# Patient Record
Sex: Male | Born: 1990 | Race: White | Hispanic: No | Marital: Single | Smoking: Never smoker
Health system: Southern US, Community
[De-identification: ages and names within clinical notes are randomized; demographics above are authoritative.]

## PROBLEM LIST (undated history)

## (undated) DIAGNOSIS — Z91030 Bee allergy status: Secondary | ICD-10-CM

## (undated) DIAGNOSIS — Z91038 Other insect allergy status: Secondary | ICD-10-CM

## (undated) HISTORY — DX: Bee allergy status: Z91.030

## (undated) HISTORY — PX: ORTHOPEDIC SURGERY: SHX850

## (undated) HISTORY — DX: Other insect allergy status: Z91.038

---
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
## 2014-12-12 DIAGNOSIS — T63481A Toxic effect of venom of other arthropod, accidental (unintentional), initial encounter: Secondary | ICD-10-CM | POA: Insufficient documentation

## 2014-12-12 DIAGNOSIS — T782XXA Anaphylactic shock, unspecified, initial encounter: Secondary | ICD-10-CM

## 2015-01-14 ENCOUNTER — Ambulatory Visit (INDEPENDENT_AMBULATORY_CARE_PROVIDER_SITE_OTHER): Payer: BC Managed Care – PPO | Admitting: *Deleted

## 2015-01-14 DIAGNOSIS — T63441D Toxic effect of venom of bees, accidental (unintentional), subsequent encounter: Secondary | ICD-10-CM

## 2015-01-21 ENCOUNTER — Ambulatory Visit (INDEPENDENT_AMBULATORY_CARE_PROVIDER_SITE_OTHER): Payer: BC Managed Care – PPO | Admitting: *Deleted

## 2015-01-21 DIAGNOSIS — T63441D Toxic effect of venom of bees, accidental (unintentional), subsequent encounter: Secondary | ICD-10-CM

## 2015-02-04 ENCOUNTER — Ambulatory Visit (INDEPENDENT_AMBULATORY_CARE_PROVIDER_SITE_OTHER): Payer: BC Managed Care – PPO | Admitting: *Deleted

## 2015-02-04 DIAGNOSIS — T63481D Toxic effect of venom of other arthropod, accidental (unintentional), subsequent encounter: Secondary | ICD-10-CM | POA: Diagnosis not present

## 2015-02-04 DIAGNOSIS — T782XXD Anaphylactic shock, unspecified, subsequent encounter: Secondary | ICD-10-CM

## 2015-02-25 ENCOUNTER — Ambulatory Visit (INDEPENDENT_AMBULATORY_CARE_PROVIDER_SITE_OTHER): Payer: BC Managed Care – PPO

## 2015-02-25 DIAGNOSIS — T782XXD Anaphylactic shock, unspecified, subsequent encounter: Secondary | ICD-10-CM

## 2015-02-25 DIAGNOSIS — T63481D Toxic effect of venom of other arthropod, accidental (unintentional), subsequent encounter: Secondary | ICD-10-CM

## 2015-03-25 ENCOUNTER — Ambulatory Visit (INDEPENDENT_AMBULATORY_CARE_PROVIDER_SITE_OTHER): Payer: BC Managed Care – PPO | Admitting: *Deleted

## 2015-03-25 DIAGNOSIS — T63441D Toxic effect of venom of bees, accidental (unintentional), subsequent encounter: Secondary | ICD-10-CM | POA: Diagnosis not present

## 2015-04-25 ENCOUNTER — Ambulatory Visit (INDEPENDENT_AMBULATORY_CARE_PROVIDER_SITE_OTHER): Payer: BC Managed Care – PPO | Admitting: *Deleted

## 2015-04-25 DIAGNOSIS — J309 Allergic rhinitis, unspecified: Secondary | ICD-10-CM | POA: Diagnosis not present

## 2015-05-30 ENCOUNTER — Ambulatory Visit (INDEPENDENT_AMBULATORY_CARE_PROVIDER_SITE_OTHER): Payer: BC Managed Care – PPO | Admitting: *Deleted

## 2015-05-30 DIAGNOSIS — IMO0001 Reserved for inherently not codable concepts without codable children: Secondary | ICD-10-CM

## 2015-05-30 DIAGNOSIS — T63441D Toxic effect of venom of bees, accidental (unintentional), subsequent encounter: Secondary | ICD-10-CM

## 2015-07-11 ENCOUNTER — Ambulatory Visit (INDEPENDENT_AMBULATORY_CARE_PROVIDER_SITE_OTHER): Payer: BC Managed Care – PPO | Admitting: *Deleted

## 2015-07-11 DIAGNOSIS — T63441D Toxic effect of venom of bees, accidental (unintentional), subsequent encounter: Secondary | ICD-10-CM

## 2015-07-11 DIAGNOSIS — IMO0001 Reserved for inherently not codable concepts without codable children: Secondary | ICD-10-CM

## 2015-08-22 ENCOUNTER — Ambulatory Visit (INDEPENDENT_AMBULATORY_CARE_PROVIDER_SITE_OTHER): Payer: BC Managed Care – PPO

## 2015-08-22 DIAGNOSIS — T63441D Toxic effect of venom of bees, accidental (unintentional), subsequent encounter: Secondary | ICD-10-CM | POA: Diagnosis not present

## 2015-10-03 ENCOUNTER — Ambulatory Visit (INDEPENDENT_AMBULATORY_CARE_PROVIDER_SITE_OTHER): Payer: BC Managed Care – PPO | Admitting: *Deleted

## 2015-10-03 DIAGNOSIS — T63441D Toxic effect of venom of bees, accidental (unintentional), subsequent encounter: Secondary | ICD-10-CM

## 2015-10-04 DIAGNOSIS — T63441D Toxic effect of venom of bees, accidental (unintentional), subsequent encounter: Secondary | ICD-10-CM | POA: Diagnosis not present

## 2015-11-01 ENCOUNTER — Ambulatory Visit (INDEPENDENT_AMBULATORY_CARE_PROVIDER_SITE_OTHER): Payer: BC Managed Care – PPO | Admitting: *Deleted

## 2015-11-01 DIAGNOSIS — T63441D Toxic effect of venom of bees, accidental (unintentional), subsequent encounter: Secondary | ICD-10-CM

## 2015-12-06 ENCOUNTER — Ambulatory Visit (INDEPENDENT_AMBULATORY_CARE_PROVIDER_SITE_OTHER): Payer: BC Managed Care – PPO

## 2015-12-06 DIAGNOSIS — T63441D Toxic effect of venom of bees, accidental (unintentional), subsequent encounter: Secondary | ICD-10-CM | POA: Diagnosis not present

## 2015-12-06 DIAGNOSIS — IMO0001 Reserved for inherently not codable concepts without codable children: Secondary | ICD-10-CM

## 2016-01-13 ENCOUNTER — Ambulatory Visit (INDEPENDENT_AMBULATORY_CARE_PROVIDER_SITE_OTHER): Payer: BC Managed Care – PPO | Admitting: *Deleted

## 2016-01-13 DIAGNOSIS — T63441D Toxic effect of venom of bees, accidental (unintentional), subsequent encounter: Secondary | ICD-10-CM | POA: Diagnosis not present

## 2016-03-16 ENCOUNTER — Ambulatory Visit (INDEPENDENT_AMBULATORY_CARE_PROVIDER_SITE_OTHER): Payer: BC Managed Care – PPO

## 2016-03-16 DIAGNOSIS — T63441D Toxic effect of venom of bees, accidental (unintentional), subsequent encounter: Secondary | ICD-10-CM | POA: Diagnosis not present

## 2016-05-18 ENCOUNTER — Ambulatory Visit (INDEPENDENT_AMBULATORY_CARE_PROVIDER_SITE_OTHER): Payer: BC Managed Care – PPO | Admitting: *Deleted

## 2016-05-18 DIAGNOSIS — T63441D Toxic effect of venom of bees, accidental (unintentional), subsequent encounter: Secondary | ICD-10-CM | POA: Diagnosis not present

## 2016-07-13 ENCOUNTER — Ambulatory Visit (INDEPENDENT_AMBULATORY_CARE_PROVIDER_SITE_OTHER): Payer: BC Managed Care – PPO | Admitting: *Deleted

## 2016-07-13 DIAGNOSIS — T63441D Toxic effect of venom of bees, accidental (unintentional), subsequent encounter: Secondary | ICD-10-CM

## 2016-09-10 ENCOUNTER — Ambulatory Visit (INDEPENDENT_AMBULATORY_CARE_PROVIDER_SITE_OTHER): Payer: BC Managed Care – PPO

## 2016-09-10 DIAGNOSIS — T63441D Toxic effect of venom of bees, accidental (unintentional), subsequent encounter: Secondary | ICD-10-CM | POA: Diagnosis not present

## 2016-11-09 ENCOUNTER — Ambulatory Visit (INDEPENDENT_AMBULATORY_CARE_PROVIDER_SITE_OTHER): Payer: BC Managed Care – PPO | Admitting: *Deleted

## 2016-11-09 DIAGNOSIS — T63441D Toxic effect of venom of bees, accidental (unintentional), subsequent encounter: Secondary | ICD-10-CM | POA: Diagnosis not present

## 2017-03-25 ENCOUNTER — Ambulatory Visit (INDEPENDENT_AMBULATORY_CARE_PROVIDER_SITE_OTHER): Payer: BC Managed Care – PPO | Admitting: *Deleted

## 2017-03-25 DIAGNOSIS — T63441D Toxic effect of venom of bees, accidental (unintentional), subsequent encounter: Secondary | ICD-10-CM

## 2017-05-17 ENCOUNTER — Ambulatory Visit (INDEPENDENT_AMBULATORY_CARE_PROVIDER_SITE_OTHER): Payer: BC Managed Care – PPO | Admitting: *Deleted

## 2017-05-17 DIAGNOSIS — T63441D Toxic effect of venom of bees, accidental (unintentional), subsequent encounter: Secondary | ICD-10-CM | POA: Diagnosis not present

## 2017-05-17 DIAGNOSIS — IMO0001 Reserved for inherently not codable concepts without codable children: Secondary | ICD-10-CM

## 2017-06-28 ENCOUNTER — Ambulatory Visit (INDEPENDENT_AMBULATORY_CARE_PROVIDER_SITE_OTHER): Payer: BC Managed Care – PPO | Admitting: *Deleted

## 2017-06-28 DIAGNOSIS — T63441D Toxic effect of venom of bees, accidental (unintentional), subsequent encounter: Secondary | ICD-10-CM

## 2017-07-29 ENCOUNTER — Encounter: Payer: Self-pay | Admitting: Allergy and Immunology

## 2017-07-29 ENCOUNTER — Other Ambulatory Visit: Payer: Self-pay

## 2017-07-29 ENCOUNTER — Ambulatory Visit (INDEPENDENT_AMBULATORY_CARE_PROVIDER_SITE_OTHER): Payer: BC Managed Care – PPO | Admitting: Allergy and Immunology

## 2017-07-29 ENCOUNTER — Encounter (HOSPITAL_BASED_OUTPATIENT_CLINIC_OR_DEPARTMENT_OTHER): Payer: Self-pay | Admitting: Emergency Medicine

## 2017-07-29 ENCOUNTER — Emergency Department (HOSPITAL_BASED_OUTPATIENT_CLINIC_OR_DEPARTMENT_OTHER)
Admission: EM | Admit: 2017-07-29 | Discharge: 2017-07-29 | Disposition: A | Discharge status: Home / Self Care | Payer: BC Managed Care – PPO | Attending: Emergency Medicine | Admitting: Emergency Medicine

## 2017-07-29 VITALS — BP 140/68 | HR 76 | Resp 16 | Ht 68.5 in | Wt 188.0 lb

## 2017-07-29 DIAGNOSIS — L501 Idiopathic urticaria: Secondary | ICD-10-CM | POA: Diagnosis not present

## 2017-07-29 DIAGNOSIS — L509 Urticaria, unspecified: Secondary | ICD-10-CM | POA: Insufficient documentation

## 2017-07-29 DIAGNOSIS — IMO0001 Reserved for inherently not codable concepts without codable children: Secondary | ICD-10-CM

## 2017-07-29 DIAGNOSIS — T63441D Toxic effect of venom of bees, accidental (unintentional), subsequent encounter: Secondary | ICD-10-CM | POA: Diagnosis not present

## 2017-07-29 DIAGNOSIS — R21 Rash and other nonspecific skin eruption: Secondary | ICD-10-CM | POA: Diagnosis present

## 2017-07-29 MED ORDER — DIPHENHYDRAMINE HCL 50 MG/ML IJ SOLN
25.0000 mg | Freq: Once | INTRAMUSCULAR | Status: AC
  Administered 2017-07-29: 25 mg via INTRAVENOUS
  Filled 2017-07-29: qty 1

## 2017-07-29 MED ORDER — METHYLPREDNISOLONE SODIUM SUCC 125 MG IJ SOLR
125.0000 mg | Freq: Once | INTRAMUSCULAR | Status: AC
  Administered 2017-07-29: 125 mg via INTRAVENOUS
  Filled 2017-07-29: qty 2

## 2017-07-29 MED ORDER — PREDNISONE 10 MG PO TABS: 20.0000 mg | ORAL_TABLET | Freq: Two times a day (BID) | ORAL | 0 refills | Status: DC

## 2017-07-29 MED ORDER — EPINEPHRINE PF 1 MG/ML IJ SOLN
0.3000 mg | Freq: Once | INTRAMUSCULAR | Status: AC
  Administered 2017-07-29: 0.3 mg via SUBCUTANEOUS
  Filled 2017-07-29: qty 1

## 2017-07-29 NOTE — ED Notes (Signed)
Computer out for reboot, pt unable to sing for dc papers, dc instructions given, prescriptions reviewed with the pt, pt and family verbalized understanding.

## 2017-07-29 NOTE — Discharge Instructions (Addendum)
Prednisone as prescribed.  Continue Benadryl 25 mg every 6 hours for the next several days.  Return to the emergency department if you develop throat swelling, difficulty breathing, or other new and concerning symptoms.

## 2017-07-29 NOTE — Progress Notes (Signed)
Follow-up Note  Referring Provider: Olive Bass, MD Primary Provider: Olive Bass, MD Date of Office Visit: 07/29/2017  Subjective:   James Ali (DOB: 1991-02-28) is a 27 y.o. male who [facility] in re-evaluation of the following:  HPI:   James Ali returns to this clinic in reevaluation of 2 main issues.  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  This past Friday, 7 days ago, he "felt bad" and was achy and tired and had a headache and then Saturday and Sunday and Monday he had diarrhea which resolved on Tuesday as did all of his systemic and constitutional symptoms.  However, he broke out with diffuse urticaria on Tuesday night and took Benadryl which worked relatively well with a decrease in his acute outbreak over several hours and then the next day he had a few urticarial lesions that once again blossomed into global involvement requiring him to go the emergency room and receive a steroid and a EpiPen last night.  He was given a prescription for prednisone and took 40 mg this morning and continues on Benadryl and feels pretty good at this point in time with a few urticarial lesions left on his hands and legs.  He has not had any unusual exposures that may account for this immunological hyperreactivity.  He did take Tums and he took some ibuprofen early in this scenario on Friday and Saturday but otherwise has not used any over-the-counter medications.   Allergies as of 07/29/2017      Reactions   Mixed Vespid Venom Anaphylaxis      Medication List      diphenhydrAMINE 25 MG tablet Commonly known as:  BENADRYL Take 50 mg by mouth every 4 (four) hours as needed.   EPIPEN 2-PAK 0.3 mg/0.3 mL Soaj injection Generic drug:  EPINEPHrine Inject 0.3 mg into the muscle  once.   finasteride 1 MG tablet Commonly known as:  PROPECIA Take 1 tablet by mouth daily.   MIXED VESPID VENOM IJ Inject as directed.   predniSONE 10 MG tablet Commonly known as:  DELTASONE Take 2 tablets (20 mg total) by mouth 2 (two) times daily.       No past medical history on file.  Past Surgical History:  Procedure Laterality Date  . ORTHOPEDIC SURGERY      Review of systems negative except as noted in HPI / PMHx or noted below:  Review of Systems  Constitutional: Negative.   HENT: Negative.   Eyes: Negative.   Respiratory: Negative.   Cardiovascular: Negative.   Gastrointestinal: Negative.   Genitourinary: Negative.   Musculoskeletal: Negative.   Skin: Negative.   Neurological: Negative.   Endo/Heme/Allergies: Negative.   Psychiatric/Behavioral: Negative.      Objective:   Vitals:   07/29/17 1338  BP: 140/68  Pulse: 76  Resp: 16   Height: 5' 8.5" (174 cm)  Weight: 188 lb (85.3 kg)   Physical Exam  HENT:  Head: Normocephalic.  Right Ear: Tympanic membrane, external ear and ear canal normal.  Left Ear: Tympanic membrane, external ear and ear canal normal.  Nose: Nose normal. No mucosal edema or rhinorrhea.  Mouth/Throat: Uvula is midline, oropharynx is clear and moist and mucous membranes are normal. No oropharyngeal exudate.  Eyes: Conjunctivae are normal.  Neck: Trachea normal.  No tracheal tenderness present. No tracheal deviation present. No thyromegaly present.  Cardiovascular: Normal rate, regular rhythm, S1 normal, S2 normal and normal heart sounds.  No murmur heard. Pulmonary/Chest: Breath sounds normal. No stridor. No respiratory distress. He has no wheezes. He has no rales.  Musculoskeletal: He exhibits no edema.  Lymphadenopathy:       Head (right side): No tonsillar adenopathy present.       Head (left side): No tonsillar adenopathy present.    He has no cervical adenopathy.  Neurological: He is alert.  Skin: Rash (Multiple  blanching erythematous urticarial lesions involving lower extremities and forearm/wrist) noted. He is not diaphoretic. No erythema. Nails show no clubbing.    Diagnostics: none  Assessment and Plan:   1. Idiopathic urticaria   2. Hymenoptera reaction, accidental or unintentional, subsequent encounter     1.  Prednisone 20 mg daily for 7 days only  2.  Cetirizine 10 mg twice a day  3.  Ranitidine 150 mg twice a day  4.  Can add Benadryl if needed  5.  Continue immunotherapy and EpiPen / Auvi-Q 0.3  6.  Further evaluation?  Yes if unresponsive or recurrent  7.  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  I have given him the plan above to help with diminishing this activity and assuming he does well he will follow-up in this clinic 1 time per year while he undergoes his immunotherapy for hymenoptera venom hypersensitivity state.  He will contact me should he have recurrent problems in the face of this treatment.  Laurette SchimkeEric Leanore Biggers, MD Allergy / Immunology [facility]

## 2017-07-29 NOTE — ED Triage Notes (Signed)
Pt states he is been having hives all over his body since last night getting worse today, pt taking Benadryl q4h with no relief, last dose yesterday at 2200.

## 2017-07-29 NOTE — Patient Instructions (Addendum)
  1.  Prednisone 20 mg daily for 7 days only  2.  Cetirizine 10 mg twice a day  3.  Ranitidine 150 mg twice a day  4.  Can add Benadryl if needed  5.  Continue immunotherapy and EpiPen / Auvi-Q 0.3  6.  Further evaluation?  Yes if unresponsive or recurrent  7.  Return to clinic in 1 year or earlier if problem

## 2017-07-29 NOTE — ED Provider Notes (Signed)
[facility] EMERGENCY DEPARTMENT Provider Note   CSN: [id number] Arrival date & time: 07/29/17  0157     History   Chief Complaint Chief Complaint  Patient presents with  . Rash    HPI Andreas NewportChristopher H Bueche is a 27 y.o. male.  Patient is a 27 year old male with no significant past medical history.  He presents today for evaluation of rash.  He reports "hives all over" for the past 12 hours.  He was recently at the beach and applied sunscreen and also reports recently consuming peanuts which he has never had an allergy to.  He denies any other new medications.  He denies any swelling of the throat.  The history is provided by the patient.  Rash   This is a new problem. The current episode started yesterday. The problem has been rapidly worsening. The problem is associated with nothing. There has been no fever.    History reviewed. No pertinent past medical history.  Patient Active Problem List   Diagnosis Date Noted  . 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 ORTHOPEDIC SURGERY          Home Medications    Prior to Admission medications   Medication Sig Start Date End Date Taking? Authorizing Provider  EPINEPHrine (EPIPEN 2-PAK) 0.3 mg/0.3 mL IJ SOAJ injection Inject 0.3 mg into the muscle once.    [provider]  MIXED VESPID VENOM IJ Inject as directed.    [provider]    Family History History reviewed. No pertinent family history.  Social History Social History   Tobacco Use  . Smoking status: Never Smoker  . Smokeless tobacco: Never Used  Substance Use Topics  . Alcohol use: Yes    Comment: ocassionally  . Drug use: Never     Allergies   Mixed vespid venom   Review of Systems Review of Systems  Skin: Positive for rash.  All other systems reviewed and are negative.    Physical Exam Updated Vital Signs BP 118/60 (BP Location: Right Arm)    Pulse 60   Temp 97.8 F (36.6 C) (Oral)   Resp 16   Ht 5\' 9"  (1.753 m)   Wt 86.2 kg (190 lb)   SpO2 100%   BMI 28.06 kg/m   Physical Exam  Constitutional: He is oriented to person, place, and time. He appears well-developed and well-nourished. No distress.  HENT:  Head: Normocephalic and atraumatic.  Mouth/Throat: Oropharynx is clear and moist.  Neck: Normal range of motion. Neck supple.  Cardiovascular: Normal rate and regular rhythm. Exam reveals no friction rub.  No murmur heard. Pulmonary/Chest: Effort normal and breath sounds normal. No respiratory distress. He has no wheezes. He has no rales.  Abdominal: Soft. Bowel sounds are normal. He exhibits no distension. There is no tenderness.  Musculoskeletal: Normal range of motion. He exhibits no edema.  Neurological: He is alert and oriented to person, place, and time. Coordination normal.  Skin: Skin is warm and dry. He is not diaphoretic.  There is a generalized urticarial rash to the extremities and torso.  Nursing note and vitals reviewed.    ED Treatments / Results  Labs (all labs ordered are listed, but only abnormal results are displayed) Labs Reviewed - No data to display  EKG None  Radiology No results found.  Procedures Procedures (including critical care time)  Medications Ordered in ED Medications  diphenhydrAMINE (BENADRYL) injection 25 mg (  has no administration in time range)  methylPREDNISolone sodium succinate (SOLU-MEDROL) 125 mg/2 mL injection 125 mg (has no administration in time range)  EPINEPHrine (ADRENALIN) 0.3 mg (has no administration in time range)     Initial Impression / Assessment and Plan / ED Course  I have reviewed the triage vital signs and the nursing notes.  Pertinent labs & imaging results that were available during my care of the patient were reviewed by me and considered in my medical decision making (see chart for details).  Patient presents with hives to the arms, legs,  and torso.  There is no evidence for an anaphylactic reaction.  He was given epi, Solu-Medrol, and Benadryl and his hives have improved.  He will be discharged with prednisone, Benadryl, and follow-up as needed.  Final Clinical Impressions(s) / ED Diagnoses   Final diagnoses:  None    ED Discharge Orders    None       Geoffery Lyons, MD 07/29/17 [phone]

## 2017-08-02 ENCOUNTER — Encounter: Payer: Self-pay | Admitting: Allergy and Immunology

## 2017-08-09 ENCOUNTER — Ambulatory Visit: Payer: BC Managed Care – PPO | Admitting: Allergy and Immunology

## 2017-08-23 ENCOUNTER — Ambulatory Visit (INDEPENDENT_AMBULATORY_CARE_PROVIDER_SITE_OTHER): Payer: BC Managed Care – PPO | Admitting: *Deleted

## 2017-08-23 DIAGNOSIS — T63441D Toxic effect of venom of bees, accidental (unintentional), subsequent encounter: Secondary | ICD-10-CM

## 2017-08-23 DIAGNOSIS — IMO0001 Reserved for inherently not codable concepts without codable children: Secondary | ICD-10-CM

## 2017-10-18 ENCOUNTER — Ambulatory Visit: Payer: BC Managed Care – PPO

## 2017-11-15 ENCOUNTER — Ambulatory Visit (INDEPENDENT_AMBULATORY_CARE_PROVIDER_SITE_OTHER): Payer: BC Managed Care – PPO | Admitting: *Deleted

## 2017-11-15 DIAGNOSIS — T63441D Toxic effect of venom of bees, accidental (unintentional), subsequent encounter: Secondary | ICD-10-CM | POA: Diagnosis not present

## 2017-11-15 DIAGNOSIS — IMO0001 Reserved for inherently not codable concepts without codable children: Secondary | ICD-10-CM

## 2018-01-10 ENCOUNTER — Ambulatory Visit (INDEPENDENT_AMBULATORY_CARE_PROVIDER_SITE_OTHER): Payer: BC Managed Care – PPO | Admitting: *Deleted

## 2018-01-10 DIAGNOSIS — IMO0001 Reserved for inherently not codable concepts without codable children: Secondary | ICD-10-CM

## 2018-01-10 DIAGNOSIS — T63441D Toxic effect of venom of bees, accidental (unintentional), subsequent encounter: Secondary | ICD-10-CM

## 2018-03-07 ENCOUNTER — Ambulatory Visit (INDEPENDENT_AMBULATORY_CARE_PROVIDER_SITE_OTHER): Payer: BC Managed Care – PPO | Admitting: *Deleted

## 2018-03-07 DIAGNOSIS — IMO0001 Reserved for inherently not codable concepts without codable children: Secondary | ICD-10-CM

## 2018-03-07 DIAGNOSIS — T63441D Toxic effect of venom of bees, accidental (unintentional), subsequent encounter: Secondary | ICD-10-CM | POA: Diagnosis not present

## 2018-05-02 ENCOUNTER — Ambulatory Visit (INDEPENDENT_AMBULATORY_CARE_PROVIDER_SITE_OTHER): Payer: BC Managed Care – PPO | Admitting: *Deleted

## 2018-05-02 DIAGNOSIS — T63441D Toxic effect of venom of bees, accidental (unintentional), subsequent encounter: Secondary | ICD-10-CM

## 2018-05-02 DIAGNOSIS — IMO0001 Reserved for inherently not codable concepts without codable children: Secondary | ICD-10-CM

## 2018-06-27 ENCOUNTER — Ambulatory Visit: Payer: Self-pay

## 2018-07-29 ENCOUNTER — Ambulatory Visit (INDEPENDENT_AMBULATORY_CARE_PROVIDER_SITE_OTHER): Payer: BC Managed Care – PPO | Admitting: *Deleted

## 2018-07-29 DIAGNOSIS — IMO0001 Reserved for inherently not codable concepts without codable children: Secondary | ICD-10-CM

## 2018-07-29 DIAGNOSIS — T63441D Toxic effect of venom of bees, accidental (unintentional), subsequent encounter: Secondary | ICD-10-CM

## 2018-08-01 ENCOUNTER — Encounter: Payer: Self-pay | Admitting: Allergy and Immunology

## 2018-08-01 ENCOUNTER — Other Ambulatory Visit: Payer: Self-pay

## 2018-08-01 ENCOUNTER — Ambulatory Visit (INDEPENDENT_AMBULATORY_CARE_PROVIDER_SITE_OTHER): Payer: BC Managed Care – PPO | Admitting: Allergy and Immunology

## 2018-08-01 VITALS — BP 104/64 | HR 60 | Resp 16

## 2018-08-01 DIAGNOSIS — T63441D Toxic effect of venom of bees, accidental (unintentional), subsequent encounter: Secondary | ICD-10-CM | POA: Diagnosis not present

## 2018-08-01 DIAGNOSIS — IMO0001 Reserved for inherently not codable concepts without codable children: Secondary | ICD-10-CM

## 2018-08-01 NOTE — Progress Notes (Signed)
[place]   Follow-up Note  Referring Provider: Olive Bassough, Robert L, MD Primary Provider: Olive Bassough, Robert L, MD Date of Office Visit: 08/01/2018  Subjective:   James Ali (DOB: 1990/07/19) is a 28 y.o. male who [facility] 08[facility]01/2018 in re-evaluation of the following:  HPI:   James OhmChris returns to this clinic in reevaluation of hymenoptera venom hypersensitivity state treated with immunotherapy.  His last visit to this clinic was 29 July 2017.  He continues on immunotherapy every 8 weeks without any adverse effect.  He has not been stung this past year in the field.  He does have an injectable epinephrine device.  Allergies as of 08/01/2018      Reactions   Mixed Vespid Venom Anaphylaxis      Medication List      EpiPen 2-Pak 0.3 mg/0.3 mL Soaj injection Generic drug:  EPINEPHrine Inject 0.3 mg into the muscle once.   finasteride 1 MG tablet Commonly known as:  PROPECIA Take 1 tablet by mouth daily.   MIXED VESPID VENOM IJ Inject as directed.   MULTIVITAMIN MEN PO Take by mouth.       Past Medical History:  Diagnosis Date  . Hymenoptera allergy     Past Surgical History:  Procedure Laterality Date  . ORTHOPEDIC SURGERY      Review of systems negative except as noted in HPI / PMHx or noted below:  Review of Systems  Constitutional: Negative.   HENT: Negative.   Eyes: Negative.   Respiratory: Negative.   Cardiovascular: Negative.   Gastrointestinal: Negative.   Genitourinary: Negative.   Musculoskeletal: Negative.   Skin: Negative.   Neurological: Negative.   Endo/Heme/Allergies: Negative.   Psychiatric/Behavioral: Negative.      Objective:   Vitals:   08/01/18 1646  BP: 104/64  Pulse: 60  Resp: 16          Physical Exam Constitutional:      Appearance: He is not diaphoretic.  HENT:     Head: Normocephalic.     Right Ear: Tympanic membrane, ear canal  and external ear normal.     Left Ear: Tympanic membrane, ear canal and external ear normal.     Nose: Nose normal. No mucosal edema or rhinorrhea.     Mouth/Throat:     Pharynx: Uvula midline. No oropharyngeal exudate.  Eyes:     Conjunctiva/sclera: Conjunctivae normal.  Neck:     Thyroid: No thyromegaly.     Trachea: Trachea normal. No tracheal tenderness or tracheal deviation.  Cardiovascular:     Rate and Rhythm: Normal rate and regular rhythm.     Heart sounds: Normal heart sounds, S1 normal and S2 normal. No murmur.  Pulmonary:     Effort: No respiratory distress.     Breath sounds: Normal breath sounds. No stridor. No wheezing or rales.  Lymphadenopathy:     Head:     Right side of head: No tonsillar adenopathy.     Left side of head: No tonsillar adenopathy.     Cervical: No cervical adenopathy.  Skin:    Findings: No erythema or rash.     Nails: There is no clubbing.   Neurological:     Mental Status: He is alert.     Diagnostics: none  Assessment and Plan:   1. Hymenoptera reaction, accidental or unintentional, subsequent encounter     1.  Continue immunotherapy and EpiPen / Auvi-Q  0.3  2.  Return to clinic in 1 year or earlier if problem  James Ali has done very well with his immunotherapy.  He will continue on another year of every 8-week immunotherapy and I will see him back in his clinic at that point in time or earlier if there is a problem.  Laurette Schimke, MD Allergy / Immunology [facility]

## 2018-08-01 NOTE — Patient Instructions (Addendum)
  1.  Continue immunotherapy and EpiPen / Auvi-Q 0.3  2.  Return to clinic in 1 year or  earlier if problem 

## 2018-08-02 ENCOUNTER — Encounter: Payer: Self-pay | Admitting: Allergy and Immunology

## 2018-09-22 ENCOUNTER — Other Ambulatory Visit: Payer: Self-pay

## 2018-09-22 ENCOUNTER — Ambulatory Visit (INDEPENDENT_AMBULATORY_CARE_PROVIDER_SITE_OTHER): Payer: BC Managed Care – PPO | Admitting: *Deleted

## 2018-09-22 DIAGNOSIS — IMO0001 Reserved for inherently not codable concepts without codable children: Secondary | ICD-10-CM

## 2018-09-22 DIAGNOSIS — T63441D Toxic effect of venom of bees, accidental (unintentional), subsequent encounter: Secondary | ICD-10-CM | POA: Diagnosis not present

## 2018-11-17 ENCOUNTER — Ambulatory Visit: Payer: Self-pay

## 2019-02-09 ENCOUNTER — Ambulatory Visit (INDEPENDENT_AMBULATORY_CARE_PROVIDER_SITE_OTHER): Payer: BC Managed Care – PPO | Admitting: *Deleted

## 2019-02-09 DIAGNOSIS — IMO0001 Reserved for inherently not codable concepts without codable children: Secondary | ICD-10-CM

## 2019-02-09 DIAGNOSIS — T63441D Toxic effect of venom of bees, accidental (unintentional), subsequent encounter: Secondary | ICD-10-CM | POA: Diagnosis not present

## 2019-04-05 ENCOUNTER — Ambulatory Visit: Payer: Self-pay

## 2019-05-22 ENCOUNTER — Ambulatory Visit (INDEPENDENT_AMBULATORY_CARE_PROVIDER_SITE_OTHER): Payer: BC Managed Care – PPO | Admitting: *Deleted

## 2019-05-22 DIAGNOSIS — T63441D Toxic effect of venom of bees, accidental (unintentional), subsequent encounter: Secondary | ICD-10-CM | POA: Diagnosis not present

## 2019-05-22 DIAGNOSIS — IMO0001 Reserved for inherently not codable concepts without codable children: Secondary | ICD-10-CM

## 2019-07-17 ENCOUNTER — Ambulatory Visit: Payer: BC Managed Care – PPO

## 2019-07-31 ENCOUNTER — Encounter: Payer: Self-pay | Admitting: Allergy and Immunology

## 2019-07-31 ENCOUNTER — Ambulatory Visit (INDEPENDENT_AMBULATORY_CARE_PROVIDER_SITE_OTHER): Payer: BC Managed Care – PPO | Admitting: Allergy and Immunology

## 2019-07-31 ENCOUNTER — Other Ambulatory Visit: Payer: Self-pay

## 2019-07-31 VITALS — BP 110/72 | HR 79 | Temp 98.0°F | Resp 16 | Ht 68.5 in | Wt 178.2 lb

## 2019-07-31 DIAGNOSIS — T63441D Toxic effect of venom of bees, accidental (unintentional), subsequent encounter: Secondary | ICD-10-CM

## 2019-07-31 DIAGNOSIS — IMO0001 Reserved for inherently not codable concepts without codable children: Secondary | ICD-10-CM

## 2019-07-31 NOTE — Patient Instructions (Addendum)
  1.  Continue immunotherapy and EpiPen / Auvi-Q 0.3  2.  Return to clinic in 1 year or  earlier if problem

## 2019-07-31 NOTE — Progress Notes (Signed)
[place] [facility] [place] [facility] [place]   Follow-up Note  Referring Provider: Algis Greenhouse, MD Primary Provider: Algis Greenhouse, MD Date of Office Visit: 07/31/2019  Subjective:   James Ali (DOB: [birth year]) is a 29 y.o. male who returns to the [facility] on 07/31/2019 in re-evaluation of the following:  HPI: James Ali returns to this clinic in evaluation of hymenoptera venom hypersensitivity state treated with immunotherapy.  His last visit to this clinic was 01 August 2018.  He continues on immunotherapy currently at every 8 weeks without any adverse effect.  He has not been stung in the past year.  He continues to carry an injectable epinephrine device.  Allergies as of 07/31/2019      Reactions   Mixed Vespid Venom Anaphylaxis      Medication List      EpiPen 2-Pak 0.3 mg/0.3 mL Soaj injection Generic drug: EPINEPHrine Inject 0.3 mg into the muscle once.   finasteride 1 MG tablet Commonly known as: PROPECIA Take 1 tablet by mouth daily.   MIXED VESPID VENOM IJ Inject as directed.   MULTIVITAMIN MEN PO Take by mouth.       Past Medical History:  Diagnosis Date  . Hymenoptera allergy     Past Surgical History:  Procedure Laterality Date  . ORTHOPEDIC SURGERY      Review of systems negative except as noted in HPI / PMHx or noted below:  Review of Systems  Constitutional: Negative.   HENT: Negative.   Eyes: Negative.   Respiratory: Negative.   Cardiovascular: Negative.   Gastrointestinal: Negative.   Genitourinary: Negative.   Musculoskeletal: Negative.   Skin: Negative.   Neurological: Negative.   Endo/Heme/Allergies: Negative.   Psychiatric/Behavioral: Negative.      Objective:   Vitals:   07/31/19 1751  BP: 110/72  Pulse: 79  Resp: 16  Temp: 98 F (36.7 C)  SpO2: 96%   Height: 5' 8.5" (174 cm)  Weight: 178 lb 3.2 oz (80.8 kg)   Physical Exam Constitutional:      Appearance: He is  not diaphoretic.  HENT:     Head: Normocephalic.     Right Ear: Tympanic membrane, ear canal and external ear normal.     Left Ear: Tympanic membrane, ear canal and external ear normal.     Nose: Nose normal. No mucosal edema or rhinorrhea.     Mouth/Throat:     Pharynx: Uvula midline. No oropharyngeal exudate.  Eyes:     Conjunctiva/sclera: Conjunctivae normal.  Neck:     Thyroid: No thyromegaly.     Trachea: Trachea normal. No tracheal tenderness or tracheal deviation.  Cardiovascular:     Rate and Rhythm: Normal rate and regular rhythm.     Heart sounds: Normal heart sounds, S1 normal and S2 normal. No murmur.  Pulmonary:     Effort: No respiratory distress.     Breath sounds: Normal breath sounds. No stridor. No wheezing or rales.  Lymphadenopathy:     Head:     Right side of head: No tonsillar adenopathy.     Left side of head: No tonsillar adenopathy.     Cervical: No cervical adenopathy.  Skin:    Findings: No erythema or rash.     Nails: There is no clubbing.  Neurological:     Mental Status: He is alert.     Diagnostics: none  Assessment and Plan:   1. Hymenoptera reaction, accidental or unintentional, subsequent  encounter     1.  Continue immunotherapy and EpiPen / Auvi-Q 0.3  2.  Return to clinic in 1 year or  earlier if problem  [name] is really doing quite well and he will continue on immunotherapy and I will see him back in his clinic in 1 year or earlier if there is a problem.  Laurette Schimke, MD Allergy / Immunology [facility]

## 2019-08-01 ENCOUNTER — Encounter: Payer: Self-pay | Admitting: Allergy and Immunology

## 2019-11-22 ENCOUNTER — Ambulatory Visit (INDEPENDENT_AMBULATORY_CARE_PROVIDER_SITE_OTHER): Payer: BC Managed Care – PPO | Admitting: *Deleted

## 2019-11-22 DIAGNOSIS — T63441D Toxic effect of venom of bees, accidental (unintentional), subsequent encounter: Secondary | ICD-10-CM | POA: Diagnosis not present

## 2019-11-22 DIAGNOSIS — IMO0001 Reserved for inherently not codable concepts without codable children: Secondary | ICD-10-CM

## 2019-12-20 ENCOUNTER — Ambulatory Visit: Payer: BC Managed Care – PPO

## 2019-12-21 ENCOUNTER — Ambulatory Visit (INDEPENDENT_AMBULATORY_CARE_PROVIDER_SITE_OTHER): Payer: BC Managed Care – PPO | Admitting: *Deleted

## 2019-12-21 DIAGNOSIS — T63441D Toxic effect of venom of bees, accidental (unintentional), subsequent encounter: Secondary | ICD-10-CM | POA: Diagnosis not present

## 2019-12-21 DIAGNOSIS — IMO0001 Reserved for inherently not codable concepts without codable children: Secondary | ICD-10-CM

## 2020-02-15 ENCOUNTER — Ambulatory Visit (INDEPENDENT_AMBULATORY_CARE_PROVIDER_SITE_OTHER): Payer: BC Managed Care – PPO | Admitting: *Deleted

## 2020-02-15 ENCOUNTER — Other Ambulatory Visit: Payer: Self-pay

## 2020-02-15 DIAGNOSIS — T782XXD Anaphylactic shock, unspecified, subsequent encounter: Secondary | ICD-10-CM | POA: Diagnosis not present

## 2020-02-15 DIAGNOSIS — T63481D Toxic effect of venom of other arthropod, accidental (unintentional), subsequent encounter: Secondary | ICD-10-CM | POA: Diagnosis not present

## 2020-04-11 ENCOUNTER — Ambulatory Visit (INDEPENDENT_AMBULATORY_CARE_PROVIDER_SITE_OTHER): Payer: BC Managed Care – PPO | Admitting: *Deleted

## 2020-04-11 ENCOUNTER — Other Ambulatory Visit: Payer: Self-pay

## 2020-04-11 DIAGNOSIS — T782XXD Anaphylactic shock, unspecified, subsequent encounter: Secondary | ICD-10-CM | POA: Diagnosis not present

## 2020-04-11 DIAGNOSIS — T63481D Toxic effect of venom of other arthropod, accidental (unintentional), subsequent encounter: Secondary | ICD-10-CM | POA: Diagnosis not present

## 2020-06-06 ENCOUNTER — Ambulatory Visit (INDEPENDENT_AMBULATORY_CARE_PROVIDER_SITE_OTHER): Payer: BC Managed Care – PPO | Admitting: *Deleted

## 2020-06-06 ENCOUNTER — Other Ambulatory Visit: Payer: Self-pay

## 2020-06-06 DIAGNOSIS — T782XXD Anaphylactic shock, unspecified, subsequent encounter: Secondary | ICD-10-CM

## 2020-06-06 DIAGNOSIS — T63481D Toxic effect of venom of other arthropod, accidental (unintentional), subsequent encounter: Secondary | ICD-10-CM | POA: Diagnosis not present

## 2020-08-01 ENCOUNTER — Other Ambulatory Visit: Payer: Self-pay

## 2020-08-01 ENCOUNTER — Ambulatory Visit (INDEPENDENT_AMBULATORY_CARE_PROVIDER_SITE_OTHER): Payer: BC Managed Care – PPO

## 2020-08-01 DIAGNOSIS — T782XXD Anaphylactic shock, unspecified, subsequent encounter: Secondary | ICD-10-CM

## 2020-08-01 DIAGNOSIS — T63481D Toxic effect of venom of other arthropod, accidental (unintentional), subsequent encounter: Secondary | ICD-10-CM

## 2020-09-26 ENCOUNTER — Ambulatory Visit (INDEPENDENT_AMBULATORY_CARE_PROVIDER_SITE_OTHER): Payer: BC Managed Care – PPO | Admitting: *Deleted

## 2020-09-26 ENCOUNTER — Other Ambulatory Visit: Payer: Self-pay

## 2020-09-26 DIAGNOSIS — T63481D Toxic effect of venom of other arthropod, accidental (unintentional), subsequent encounter: Secondary | ICD-10-CM

## 2020-09-26 DIAGNOSIS — T782XXD Anaphylactic shock, unspecified, subsequent encounter: Secondary | ICD-10-CM

## 2020-11-21 ENCOUNTER — Ambulatory Visit (INDEPENDENT_AMBULATORY_CARE_PROVIDER_SITE_OTHER): Payer: BC Managed Care – PPO | Admitting: *Deleted

## 2020-11-21 ENCOUNTER — Other Ambulatory Visit: Payer: Self-pay

## 2020-11-21 DIAGNOSIS — T782XXD Anaphylactic shock, unspecified, subsequent encounter: Secondary | ICD-10-CM

## 2020-11-21 DIAGNOSIS — T63481D Toxic effect of venom of other arthropod, accidental (unintentional), subsequent encounter: Secondary | ICD-10-CM | POA: Diagnosis not present

## 2021-01-16 ENCOUNTER — Ambulatory Visit (INDEPENDENT_AMBULATORY_CARE_PROVIDER_SITE_OTHER): Payer: BC Managed Care – PPO | Admitting: *Deleted

## 2021-01-16 ENCOUNTER — Other Ambulatory Visit: Payer: Self-pay

## 2021-01-16 DIAGNOSIS — T782XXD Anaphylactic shock, unspecified, subsequent encounter: Secondary | ICD-10-CM | POA: Diagnosis not present

## 2021-01-16 DIAGNOSIS — T63481D Toxic effect of venom of other arthropod, accidental (unintentional), subsequent encounter: Secondary | ICD-10-CM | POA: Diagnosis not present

## 2021-03-13 ENCOUNTER — Ambulatory Visit (INDEPENDENT_AMBULATORY_CARE_PROVIDER_SITE_OTHER): Payer: BC Managed Care – PPO | Admitting: *Deleted

## 2021-03-13 ENCOUNTER — Other Ambulatory Visit: Payer: Self-pay

## 2021-03-13 DIAGNOSIS — T782XXD Anaphylactic shock, unspecified, subsequent encounter: Secondary | ICD-10-CM

## 2021-03-13 DIAGNOSIS — T63481D Toxic effect of venom of other arthropod, accidental (unintentional), subsequent encounter: Secondary | ICD-10-CM | POA: Diagnosis not present

## 2021-05-08 ENCOUNTER — Ambulatory Visit (INDEPENDENT_AMBULATORY_CARE_PROVIDER_SITE_OTHER): Payer: BC Managed Care – PPO | Admitting: *Deleted

## 2021-05-08 ENCOUNTER — Other Ambulatory Visit: Payer: Self-pay

## 2021-05-08 DIAGNOSIS — T782XXD Anaphylactic shock, unspecified, subsequent encounter: Secondary | ICD-10-CM | POA: Diagnosis not present

## 2021-05-08 DIAGNOSIS — T63481D Toxic effect of venom of other arthropod, accidental (unintentional), subsequent encounter: Secondary | ICD-10-CM | POA: Diagnosis not present

## 2021-07-03 ENCOUNTER — Ambulatory Visit (INDEPENDENT_AMBULATORY_CARE_PROVIDER_SITE_OTHER): Payer: BC Managed Care – PPO | Admitting: *Deleted

## 2021-07-03 ENCOUNTER — Other Ambulatory Visit: Payer: Self-pay

## 2021-07-03 DIAGNOSIS — T63481D Toxic effect of venom of other arthropod, accidental (unintentional), subsequent encounter: Secondary | ICD-10-CM

## 2021-07-03 DIAGNOSIS — T782XXD Anaphylactic shock, unspecified, subsequent encounter: Secondary | ICD-10-CM | POA: Diagnosis not present

## 2021-08-28 ENCOUNTER — Ambulatory Visit (INDEPENDENT_AMBULATORY_CARE_PROVIDER_SITE_OTHER): Payer: BC Managed Care – PPO | Admitting: *Deleted

## 2021-08-28 DIAGNOSIS — T782XXD Anaphylactic shock, unspecified, subsequent encounter: Secondary | ICD-10-CM | POA: Diagnosis not present

## 2021-08-28 DIAGNOSIS — T63481D Toxic effect of venom of other arthropod, accidental (unintentional), subsequent encounter: Secondary | ICD-10-CM

## 2021-10-23 ENCOUNTER — Ambulatory Visit (INDEPENDENT_AMBULATORY_CARE_PROVIDER_SITE_OTHER): Payer: BC Managed Care – PPO | Admitting: *Deleted

## 2021-10-23 DIAGNOSIS — T63481D Toxic effect of venom of other arthropod, accidental (unintentional), subsequent encounter: Secondary | ICD-10-CM

## 2021-10-23 DIAGNOSIS — T782XXD Anaphylactic shock, unspecified, subsequent encounter: Secondary | ICD-10-CM | POA: Diagnosis not present

## 2021-12-18 ENCOUNTER — Ambulatory Visit (INDEPENDENT_AMBULATORY_CARE_PROVIDER_SITE_OTHER): Payer: BC Managed Care – PPO | Admitting: *Deleted

## 2021-12-18 DIAGNOSIS — T63481D Toxic effect of venom of other arthropod, accidental (unintentional), subsequent encounter: Secondary | ICD-10-CM

## 2021-12-18 DIAGNOSIS — T782XXD Anaphylactic shock, unspecified, subsequent encounter: Secondary | ICD-10-CM

## 2022-02-12 ENCOUNTER — Ambulatory Visit (INDEPENDENT_AMBULATORY_CARE_PROVIDER_SITE_OTHER): Payer: BC Managed Care – PPO | Admitting: *Deleted

## 2022-02-12 DIAGNOSIS — T63481D Toxic effect of venom of other arthropod, accidental (unintentional), subsequent encounter: Secondary | ICD-10-CM

## 2022-02-12 DIAGNOSIS — T782XXD Anaphylactic shock, unspecified, subsequent encounter: Secondary | ICD-10-CM | POA: Diagnosis not present

## 2022-04-09 ENCOUNTER — Ambulatory Visit (INDEPENDENT_AMBULATORY_CARE_PROVIDER_SITE_OTHER): Payer: BC Managed Care – PPO | Admitting: *Deleted

## 2022-04-09 DIAGNOSIS — T782XXD Anaphylactic shock, unspecified, subsequent encounter: Secondary | ICD-10-CM | POA: Diagnosis not present

## 2022-04-09 DIAGNOSIS — T63481D Toxic effect of venom of other arthropod, accidental (unintentional), subsequent encounter: Secondary | ICD-10-CM | POA: Diagnosis not present

## 2022-06-04 ENCOUNTER — Ambulatory Visit (INDEPENDENT_AMBULATORY_CARE_PROVIDER_SITE_OTHER): Payer: BC Managed Care – PPO | Admitting: *Deleted

## 2022-06-04 DIAGNOSIS — T63481D Toxic effect of venom of other arthropod, accidental (unintentional), subsequent encounter: Secondary | ICD-10-CM | POA: Diagnosis not present

## 2022-06-04 DIAGNOSIS — T782XXD Anaphylactic shock, unspecified, subsequent encounter: Secondary | ICD-10-CM | POA: Diagnosis not present

## 2022-07-30 ENCOUNTER — Ambulatory Visit: Payer: BC Managed Care – PPO | Admitting: *Deleted

## 2022-07-30 ENCOUNTER — Ambulatory Visit (INDEPENDENT_AMBULATORY_CARE_PROVIDER_SITE_OTHER): Payer: BC Managed Care – PPO | Admitting: *Deleted

## 2022-07-30 DIAGNOSIS — T63481D Toxic effect of venom of other arthropod, accidental (unintentional), subsequent encounter: Secondary | ICD-10-CM | POA: Diagnosis not present

## 2022-07-30 DIAGNOSIS — T782XXD Anaphylactic shock, unspecified, subsequent encounter: Secondary | ICD-10-CM | POA: Diagnosis not present

## 2022-09-24 ENCOUNTER — Ambulatory Visit (INDEPENDENT_AMBULATORY_CARE_PROVIDER_SITE_OTHER): Payer: BC Managed Care – PPO | Admitting: *Deleted

## 2022-09-24 DIAGNOSIS — T782XXD Anaphylactic shock, unspecified, subsequent encounter: Secondary | ICD-10-CM | POA: Diagnosis not present

## 2022-09-24 DIAGNOSIS — T63481D Toxic effect of venom of other arthropod, accidental (unintentional), subsequent encounter: Secondary | ICD-10-CM | POA: Diagnosis not present

## 2022-11-19 ENCOUNTER — Ambulatory Visit: Payer: BC Managed Care – PPO

## 2022-11-26 ENCOUNTER — Ambulatory Visit (INDEPENDENT_AMBULATORY_CARE_PROVIDER_SITE_OTHER): Payer: BC Managed Care – PPO | Admitting: *Deleted

## 2022-11-26 DIAGNOSIS — T782XXD Anaphylactic shock, unspecified, subsequent encounter: Secondary | ICD-10-CM

## 2022-11-26 DIAGNOSIS — T63481D Toxic effect of venom of other arthropod, accidental (unintentional), subsequent encounter: Secondary | ICD-10-CM | POA: Diagnosis not present

## 2023-01-21 ENCOUNTER — Ambulatory Visit (INDEPENDENT_AMBULATORY_CARE_PROVIDER_SITE_OTHER): Payer: BC Managed Care – PPO

## 2023-01-21 DIAGNOSIS — T782XXD Anaphylactic shock, unspecified, subsequent encounter: Secondary | ICD-10-CM

## 2023-01-21 DIAGNOSIS — T63481D Toxic effect of venom of other arthropod, accidental (unintentional), subsequent encounter: Secondary | ICD-10-CM | POA: Diagnosis not present

## 2023-03-18 ENCOUNTER — Ambulatory Visit (INDEPENDENT_AMBULATORY_CARE_PROVIDER_SITE_OTHER): Payer: BC Managed Care – PPO | Admitting: *Deleted

## 2023-03-18 DIAGNOSIS — T63481D Toxic effect of venom of other arthropod, accidental (unintentional), subsequent encounter: Secondary | ICD-10-CM

## 2023-03-18 DIAGNOSIS — T782XXD Anaphylactic shock, unspecified, subsequent encounter: Secondary | ICD-10-CM | POA: Diagnosis not present

## 2023-05-05 ENCOUNTER — Encounter (HOSPITAL_BASED_OUTPATIENT_CLINIC_OR_DEPARTMENT_OTHER): Payer: Self-pay | Admitting: Emergency Medicine

## 2023-05-05 ENCOUNTER — Ambulatory Visit (HOSPITAL_BASED_OUTPATIENT_CLINIC_OR_DEPARTMENT_OTHER)
Admission: EM | Admit: 2023-05-05 | Discharge: 2023-05-05 | Disposition: A | Discharge status: Home / Self Care | Payer: 59 | Attending: Family Medicine | Admitting: Family Medicine

## 2023-05-05 DIAGNOSIS — J101 Influenza due to other identified influenza virus with other respiratory manifestations: Secondary | ICD-10-CM | POA: Diagnosis not present

## 2023-05-05 DIAGNOSIS — R509 Fever, unspecified: Secondary | ICD-10-CM

## 2023-05-05 DIAGNOSIS — R051 Acute cough: Secondary | ICD-10-CM

## 2023-05-05 LAB — POCT INFLUENZA A/B
Influenza A, POC: POSITIVE — AB
Influenza B, POC: NEGATIVE

## 2023-05-05 LAB — POCT RAPID STREP A (OFFICE): Rapid Strep A Screen: NEGATIVE

## 2023-05-05 MED ORDER — OSELTAMIVIR PHOSPHATE 75 MG PO CAPS: 75.0000 mg | ORAL_CAPSULE | Freq: Two times a day (BID) | ORAL | 0 refills | Status: AC

## 2023-05-05 MED ORDER — PROMETHAZINE-DM 6.25-15 MG/5ML PO SYRP: 5.0000 mL | ORAL_SOLUTION | Freq: Four times a day (QID) | ORAL | 0 refills | Status: DC | PRN

## 2023-05-05 MED ORDER — OSELTAMIVIR PHOSPHATE 6 MG/ML PO SUSR: 75.0000 mg | Freq: Two times a day (BID) | ORAL | 0 refills | Status: DC

## 2023-05-05 NOTE — Discharge Instructions (Addendum)
Tamiflu, 75 mg, twice daily for 5 days.  He was positive for flu type a and negative for strep.  Get plenty of fluids.  Get plenty of rest.  Promethazine DM, 1 teaspoon, every 6 hours, as needed for cough.  Do not use and drive as it could make you drowsy.  Use acetaminophen or ibuprofen, every 4 hours, as directed on the package, as needed for fever or throat pain.  Follow-up if symptoms do not improve, worsen or new symptoms occur.

## 2023-05-05 NOTE — ED Triage Notes (Signed)
Pt c/o body aches started yesterday, fever this morning, congested, some coughing.

## 2023-05-05 NOTE — ED Provider Notes (Signed)
[facility]    CSN: [id number] Arrival date & time: 05/05/23  1014      History   Chief Complaint Chief Complaint  Patient presents with   Generalized Body Aches   Fever    HPI James Ali is a 33 y.o. male.   Reports acute onset of cough, congestion, fever, body aches, that started yesterday midday.  Much worse last night.   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 Authorizing Provider  finasteride (PROPECIA) 1 MG tablet Take 1 tablet by mouth daily. 07/18/17  Yes [provider]  oseltamivir (TAMIFLU) 75 MG capsule Take 1 capsule (75 mg total) by mouth every 12 (twelve) hours for 5 days. 05/05/23 05/10/23 Yes Prescilla Sours, FNP  promethazine-dextromethorphan (PROMETHAZINE-DM) 6.25-15 MG/5ML syrup Take 5 mLs by mouth 4 (four) times daily as needed for cough. 05/05/23  Yes Prescilla Sours, FNP  EPINEPHrine (EPIPEN 2-PAK) 0.3 mg/0.3 mL IJ SOAJ injection Inject 0.3 mg into the muscle once.    [provider]  MIXED VESPID VENOM IJ Inject as directed.    [provider]  Multiple Vitamins-Minerals (MULTIVITAMIN MEN PO) Take by mouth.    [provider]    Family History Family History  Problem Relation Age of Onset   Breast cancer Maternal Grandmother    Breast cancer Paternal Grandmother    Colon cancer Paternal Grandfather     Social History Social History   Tobacco Use   Smoking status: Never   Smokeless tobacco: Never  Substance Use Topics   Alcohol  use: Yes    Comment: ocassionally   Drug use: Never     Allergies   Mixed vespid venom   Review of Systems Review of Systems  Constitutional:  Positive for fever. Negative for chills.  HENT:  Positive for sore throat. Negative for ear pain.   Eyes:  Negative for pain and visual disturbance.  Respiratory:  Positive for cough. Negative for shortness of breath.   Cardiovascular:  Negative for chest pain and palpitations.  Gastrointestinal:  Negative for abdominal pain and vomiting.  Genitourinary:  Negative for dysuria and hematuria.  Musculoskeletal:  Negative for arthralgias and back pain.  Skin:  Negative for color change and rash.  Neurological:  Negative for seizures and syncope.  All other systems reviewed and are negative.    Physical Exam Triage Vital Signs ED Triage Vitals  Encounter Vitals Group     BP 05/05/23 1100 116/75     Systolic BP Percentile --      Diastolic BP Percentile --      Pulse Rate 05/05/23 1100 88     Resp 05/05/23 1100 18     Temp 05/05/23 1100 97.8 F (36.6 C)     Temp Source 05/05/23 1100 Oral     SpO2 05/05/23 1100 96 %     Weight --      Height --      Head Circumference --  Peak Flow --      Pain Score 05/05/23 1059 0     Pain Loc --      Pain Education --      Exclude from Growth Chart --    No data found.  Updated Vital Signs BP 116/75 (BP Location: Right Arm)   Pulse 88   Temp 97.8 F (36.6 C) (Oral)   Resp 18   SpO2 96%   Visual Acuity Right Eye Distance:   Left Eye Distance:   Bilateral Distance:    Right Eye Near:   Left Eye Near:    Bilateral Near:     Physical Exam Vitals and nursing note reviewed.  Constitutional:      General: He is not in acute distress.    Appearance: He is well-developed. He is not ill-appearing or toxic-appearing.  HENT:     Head: Normocephalic and atraumatic.     Right Ear: Hearing, tympanic membrane, ear canal and external ear normal.     Left Ear: Hearing, tympanic  membrane, ear canal and external ear normal.     Nose: Congestion and rhinorrhea present. Rhinorrhea is clear.     Mouth/Throat:     Lips: Pink.     Mouth: Mucous membranes are moist.     Pharynx: Uvula midline. Posterior oropharyngeal erythema present. No oropharyngeal exudate.     Tonsils: No tonsillar exudate.  Eyes:     Conjunctiva/sclera: Conjunctivae normal.     Pupils: Pupils are equal, round, and reactive to light.  Cardiovascular:     Rate and Rhythm: Normal rate and regular rhythm.     Heart sounds: S1 normal and S2 normal. No murmur heard. Pulmonary:     Effort: Pulmonary effort is normal. No respiratory distress.     Breath sounds: Normal breath sounds. No decreased breath sounds, wheezing, rhonchi or rales.  Abdominal:     Palpations: Abdomen is soft.     Tenderness: There is no abdominal tenderness.  Musculoskeletal:        General: No swelling.     Cervical back: Neck supple.  Lymphadenopathy:     Head:     Right side of head: Tonsillar adenopathy present. No submental, submandibular, preauricular or posterior auricular adenopathy.     Left side of head: Tonsillar adenopathy present. No submental, submandibular, preauricular or posterior auricular adenopathy.     Cervical: No cervical adenopathy.     Right cervical: No superficial cervical adenopathy.    Left cervical: No superficial cervical adenopathy.  Skin:    General: Skin is warm and dry.     Capillary Refill: Capillary refill takes less than 2 seconds.     Findings: No rash.  Neurological:     Mental Status: He is alert and oriented to person, place, and time.  Psychiatric:        Mood and Affect: Mood normal.      UC Treatments / Results  Labs (all labs ordered are listed, but only abnormal results are displayed) Labs Reviewed  POCT INFLUENZA A/B - Abnormal; Notable for the following components:      Result Value   Influenza A, POC Positive (*)    All other components within normal limits  POCT  RAPID STREP A (OFFICE) - Normal    EKG   Radiology No results found.  Procedures Procedures (including critical care time)  Medications Ordered in UC Medications - No data to display  Initial Impression / Assessment and Plan / UC Course  I have reviewed the triage vital signs and the nursing notes.  Pertinent labs & imaging results that were available during my care of the patient were reviewed by me and considered in my medical decision making (see chart for details).  Negative for strep.  Positive for influenza type A.  Will treat with Tamiflu, 75 mg, twice daily for 5 days.  Provided Promethazine DM, 5 mL, every 6 hours, as needed for cough.  Promethazine DM could make you sleepy, do not use and drive.  Get plenty of fluids and rest.  Provided work excuse.  Follow-up if symptoms do not improve, worsen or new symptoms occur. Final Clinical Impressions(s) / UC Diagnoses   Final diagnoses:  Fever, unspecified  Acute cough  Type A influenza     Discharge Instructions      Tamiflu, 75 mg, twice daily for 5 days.  He was positive for flu type a and negative for strep.  Get plenty of fluids.  Get plenty of rest.  Promethazine DM, 1 teaspoon, every 6 hours, as needed for cough.  Do not use and drive as it could make you drowsy.  Use acetaminophen or ibuprofen, every 4 hours, as directed on the package, as needed for fever or throat pain.  Follow-up if symptoms do not improve, worsen or new symptoms occur.     ED Prescriptions     Medication Sig Dispense Auth. Provider   oseltamivir (TAMIFLU) 6 MG/ML SUSR suspension  (Status: Discontinued) Take 12.5 mLs (75 mg total) by mouth 2 (two) times daily for 5 days. 10 mL Prescilla Sours, FNP   oseltamivir (TAMIFLU) 75 MG capsule Take 1 capsule (75 mg total) by mouth every 12 (twelve) hours for 5 days. 10 capsule Prescilla Sours, FNP   promethazine-dextromethorphan (PROMETHAZINE-DM) 6.25-15 MG/5ML syrup Take 5 mLs by mouth 4 (four) times  daily as needed for cough. 118 mL Prescilla Sours, FNP      PDMP not reviewed this encounter.   Prescilla Sours, FNP 05/05/23 1159

## 2023-05-13 ENCOUNTER — Ambulatory Visit (INDEPENDENT_AMBULATORY_CARE_PROVIDER_SITE_OTHER): Payer: 59 | Admitting: *Deleted

## 2023-05-13 DIAGNOSIS — T63481D Toxic effect of venom of other arthropod, accidental (unintentional), subsequent encounter: Secondary | ICD-10-CM | POA: Diagnosis not present

## 2023-05-13 DIAGNOSIS — T782XXD Anaphylactic shock, unspecified, subsequent encounter: Secondary | ICD-10-CM | POA: Diagnosis not present

## 2023-06-14 ENCOUNTER — Encounter: Payer: Self-pay | Admitting: Allergy and Immunology

## 2023-06-14 ENCOUNTER — Ambulatory Visit: Payer: 59 | Admitting: Allergy and Immunology

## 2023-06-14 VITALS — BP 112/82 | HR 68 | Resp 12 | Ht 69.0 in | Wt 174.4 lb

## 2023-06-14 DIAGNOSIS — T782XXD Anaphylactic shock, unspecified, subsequent encounter: Secondary | ICD-10-CM | POA: Diagnosis not present

## 2023-06-14 DIAGNOSIS — T63481D Toxic effect of venom of other arthropod, accidental (unintentional), subsequent encounter: Secondary | ICD-10-CM | POA: Diagnosis not present

## 2023-06-14 NOTE — Progress Notes (Unsigned)
 [place] - [place] - [place] - [place] - [place]   Follow-up Note  Referring Provider: Olive Bass, MD Primary Provider: Olive Bass, MD Date of Office Visit: 06/14/2023  Subjective:   James Ali (DOB: [birth year]) is a 33 y.o. 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  I last saw him in this clinic 31 July 2019.  He continues to receive immunotherapy without any adverse effect directed against hymenoptera venom.  He has not had any adverse effect in the field after being stung.  He thinks his sting was by a wasp and not a vespid.  He does have injectable epinephrine available.  Allergies as of 06/14/2023       Reactions   Mixed Vespid Venom Anaphylaxis        Medication List    EpiPen 2-Pak 0.3 mg/0.3 mL Soaj injection Generic drug: EPINEPHrine Inject 0.3 mg into the muscle once.   finasteride 1 MG tablet Commonly known as: PROPECIA Take 1 tablet by mouth daily.    Past Medical History:  Diagnosis Date   Hymenoptera allergy     Past Surgical History:  Procedure Laterality Date   ORTHOPEDIC SURGERY      Review of systems negative except as noted in HPI / PMHx or noted below:  Review of Systems  Constitutional: Negative.   HENT: Negative.    Eyes: Negative.   Respiratory: Negative.    Cardiovascular: Negative.   Gastrointestinal: Negative.   Genitourinary: Negative.   Musculoskeletal: Negative.   Skin: Negative.   Neurological: Negative.   Endo/Heme/Allergies: Negative.   Psychiatric/Behavioral: Negative.       Objective:   Vitals:   06/14/23 1642  BP: 112/82  Pulse: 68  Resp: 12  SpO2: 95%   Height: 5\' 9"  (175.3 cm)  Weight: 174 lb 6.4 oz (79.1 kg)   Physical Exam Constitutional:      Appearance: He is not diaphoretic.  HENT:      Head: Normocephalic.     Right Ear: Tympanic membrane, ear canal and external ear normal.     Left Ear: Tympanic membrane, ear canal and external ear normal.     Nose: Nose normal. No mucosal edema or rhinorrhea.     Mouth/Throat:     Pharynx: Uvula midline. No oropharyngeal exudate.  Eyes:     Conjunctiva/sclera: Conjunctivae normal.  Neck:     Thyroid: No thyromegaly.     Trachea: Trachea normal. No tracheal tenderness or tracheal deviation.  Cardiovascular:     Rate and Rhythm: Normal rate and regular rhythm.     Heart sounds: Normal heart sounds, S1 normal and S2 normal. No murmur heard. Pulmonary:     Effort: No respiratory distress.     Breath sounds: Normal breath sounds. No stridor. No wheezing or rales.  Lymphadenopathy:     Head:     Right side of head: No tonsillar adenopathy.     Left side of head: No tonsillar adenopathy.     Cervical: No cervical adenopathy.  Skin:    Findings: No erythema or rash.     Nails: There is no clubbing.  Neurological:     Mental Status: He is alert.     Diagnostics: none  Assessment and Plan:   1. Anaphylaxis due to hymenoptera venom, accidental or unintentional, subsequent encounter  1.  Continue immunotherapy against hymenoptera venom  2.  EpiPen or Neffy for allergic reaction  3.  Influenza = Tamiflu.  COVID = Paxlovid.  4.  Return to clinic in 1 year or earlier if problem  James Ali will continue on immunotherapy directed against hymenoptera venom and we will see him back in his clinic in 1 year or earlier if there is a problem.  Laurette Schimke, MD Allergy / Immunology [facility]

## 2023-06-14 NOTE — Patient Instructions (Addendum)
  1.  Continue immunotherapy against hymenoptera venom  2.  EpiPen or Neffy for allergic reaction  3.  Influenza = Tamiflu.  COVID = Paxlovid.  4.  Return to clinic in 1 year or earlier if problem

## 2023-06-15 ENCOUNTER — Encounter: Payer: Self-pay | Admitting: Allergy and Immunology

## 2023-07-08 ENCOUNTER — Ambulatory Visit: Payer: Self-pay

## 2024-03-02 ENCOUNTER — Ambulatory Visit: Admitting: *Deleted

## 2024-03-02 DIAGNOSIS — T63481D Toxic effect of venom of other arthropod, accidental (unintentional), subsequent encounter: Secondary | ICD-10-CM | POA: Diagnosis not present

## 2024-03-02 DIAGNOSIS — T782XXD Anaphylactic shock, unspecified, subsequent encounter: Secondary | ICD-10-CM | POA: Diagnosis not present

## 2024-03-08 ENCOUNTER — Ambulatory Visit (INDEPENDENT_AMBULATORY_CARE_PROVIDER_SITE_OTHER)

## 2024-03-08 DIAGNOSIS — T782XXD Anaphylactic shock, unspecified, subsequent encounter: Secondary | ICD-10-CM

## 2024-03-08 DIAGNOSIS — T63481D Toxic effect of venom of other arthropod, accidental (unintentional), subsequent encounter: Secondary | ICD-10-CM

## 2024-03-16 ENCOUNTER — Ambulatory Visit: Admitting: *Deleted

## 2024-03-16 DIAGNOSIS — T63481D Toxic effect of venom of other arthropod, accidental (unintentional), subsequent encounter: Secondary | ICD-10-CM

## 2024-03-16 DIAGNOSIS — T782XXD Anaphylactic shock, unspecified, subsequent encounter: Secondary | ICD-10-CM | POA: Diagnosis not present

## 2024-03-30 ENCOUNTER — Ambulatory Visit (INDEPENDENT_AMBULATORY_CARE_PROVIDER_SITE_OTHER): Admitting: *Deleted

## 2024-03-30 DIAGNOSIS — T63481D Toxic effect of venom of other arthropod, accidental (unintentional), subsequent encounter: Secondary | ICD-10-CM | POA: Diagnosis not present

## 2024-03-30 DIAGNOSIS — T782XXD Anaphylactic shock, unspecified, subsequent encounter: Secondary | ICD-10-CM | POA: Diagnosis not present

## 2024-04-18 DIAGNOSIS — T782XXD Anaphylactic shock, unspecified, subsequent encounter: Secondary | ICD-10-CM | POA: Diagnosis not present

## 2024-04-18 DIAGNOSIS — T63481D Toxic effect of venom of other arthropod, accidental (unintentional), subsequent encounter: Secondary | ICD-10-CM | POA: Diagnosis not present

## 2024-05-03 ENCOUNTER — Ambulatory Visit

## 2024-05-03 DIAGNOSIS — T63481D Toxic effect of venom of other arthropod, accidental (unintentional), subsequent encounter: Secondary | ICD-10-CM

## 2024-05-11 ENCOUNTER — Ambulatory Visit (INDEPENDENT_AMBULATORY_CARE_PROVIDER_SITE_OTHER): Admitting: *Deleted

## 2024-05-11 DIAGNOSIS — T63481D Toxic effect of venom of other arthropod, accidental (unintentional), subsequent encounter: Secondary | ICD-10-CM

## 2024-05-11 DIAGNOSIS — T782XXD Anaphylactic shock, unspecified, subsequent encounter: Secondary | ICD-10-CM | POA: Diagnosis not present

## 2024-05-18 ENCOUNTER — Ambulatory Visit: Admitting: *Deleted

## 2024-05-18 DIAGNOSIS — T63481D Toxic effect of venom of other arthropod, accidental (unintentional), subsequent encounter: Secondary | ICD-10-CM

## 2024-05-18 DIAGNOSIS — T782XXD Anaphylactic shock, unspecified, subsequent encounter: Secondary | ICD-10-CM

## 2024-06-14 ENCOUNTER — Ambulatory Visit: Admitting: Allergy and Immunology
